# Patient Record
Sex: Female | Born: 1970 | Race: White | Hispanic: No | Marital: Married | Smoking: Former smoker
Health system: Southern US, Community
[De-identification: ages and names within clinical notes are randomized; demographics above are authoritative.]

## PROBLEM LIST (undated history)

## (undated) HISTORY — PX: FRACTURE SURGERY: SHX138

---

## 2009-09-01 ENCOUNTER — Ambulatory Visit: Payer: Self-pay | Admitting: Internal Medicine

## 2009-10-07 ENCOUNTER — Emergency Department: Payer: Self-pay | Admitting: Emergency Medicine

## 2010-01-18 ENCOUNTER — Ambulatory Visit: Payer: Self-pay | Admitting: Internal Medicine

## 2010-08-15 ENCOUNTER — Ambulatory Visit: Payer: Self-pay | Admitting: Internal Medicine

## 2010-11-01 ENCOUNTER — Emergency Department: Payer: Self-pay

## 2010-11-08 ENCOUNTER — Other Ambulatory Visit: Payer: Self-pay | Admitting: Internal Medicine

## 2010-11-11 ENCOUNTER — Ambulatory Visit: Payer: Self-pay | Admitting: Internal Medicine

## 2010-12-01 ENCOUNTER — Ambulatory Visit: Payer: Self-pay | Admitting: Gastroenterology

## 2011-09-01 IMAGING — NM NUCLEAR MEDICINE HEPATOHBILIARY INCLUDE GB
2 series · 12 of 12 positions shown · non-contrast
Comparison: none

REASON FOR EXAM: generalized abd pain
COMMENTS:

[Series 1000: gallbladder dynamic · 4.80mm/px · 6 of 60 frames shown]
[frame 6/60]
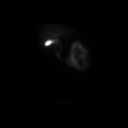
[frame 16/60]
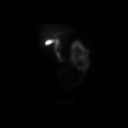
[frame 26/60]
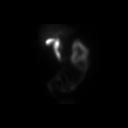
[frame 36/60]
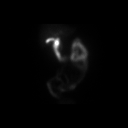
[frame 46/60]
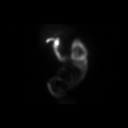
[frame 56/60]
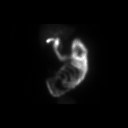

[Series 1000: gallbladder dynamic (results) · 4.80mm/px · 6 of 60 frames shown]
[frame 6/60]
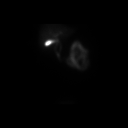
[frame 16/60]
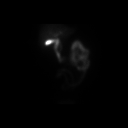
[frame 26/60]
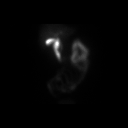
[frame 36/60]
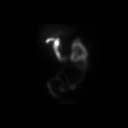
[frame 46/60]
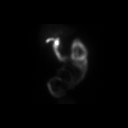
[frame 56/60]
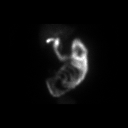

[12 of 12 positions shown; findings below may reference images not displayed]

PROCEDURE:     KNM - KNM HEPATO W/GB EJECT FRACTION  - November 11, 2010 [DATE]

RESULT:     Following intravenous administration of 8.10 mCi technetium 99m
Choletec, there is observed prompt visualization of tracer activity in the
liver 3 minutes. At 30 minutes tracer activity is visualized in the
gallbladder, common duct and proximal small bowel.

The gallbladder ejection fraction at 30 minutes measures 86% which is in the
normal range.
IMPRESSION: 1. Normal hepatobiliary scan.
2. The gallbladder ejection fraction measures 86% which is in the normal
range.

## 2011-09-05 ENCOUNTER — Ambulatory Visit: Payer: Self-pay | Admitting: Obstetrics & Gynecology

## 2012-06-10 ENCOUNTER — Ambulatory Visit: Payer: Self-pay | Admitting: Emergency Medicine

## 2014-12-20 ENCOUNTER — Ambulatory Visit: Payer: Self-pay | Admitting: Physician Assistant

## 2015-11-12 ENCOUNTER — Ambulatory Visit
Admission: EM | Admit: 2015-11-12 | Discharge: 2015-11-12 | Disposition: A | Discharge status: Home / Self Care | Payer: Managed Care, Other (non HMO) | Attending: Family Medicine | Admitting: Family Medicine

## 2015-11-12 ENCOUNTER — Encounter: Payer: Self-pay | Admitting: Emergency Medicine

## 2015-11-12 DIAGNOSIS — H05011 Cellulitis of right orbit: Secondary | ICD-10-CM | POA: Diagnosis not present

## 2015-11-12 DIAGNOSIS — H6981 Other specified disorders of Eustachian tube, right ear: Secondary | ICD-10-CM

## 2015-11-12 DIAGNOSIS — J019 Acute sinusitis, unspecified: Secondary | ICD-10-CM | POA: Diagnosis not present

## 2015-11-12 MED ORDER — AMOXICILLIN-POT CLAVULANATE 875-125 MG PO TABS: 1.0000 | ORAL_TABLET | Freq: Two times a day (BID) | ORAL | Status: DC

## 2015-11-12 MED ORDER — FLUTICASONE PROPIONATE 50 MCG/ACT NA SUSP: 2.0000 | Freq: Every day | NASAL | Status: DC

## 2015-11-12 MED ORDER — LORATADINE-PSEUDOEPHEDRINE ER 10-240 MG PO TB24: 1.0000 | ORAL_TABLET | Freq: Every day | ORAL | Status: DC

## 2015-11-12 NOTE — ED Notes (Signed)
Patient c/o sinus pain and pressure since Wed.

## 2015-11-12 NOTE — ED Provider Notes (Addendum)
CSN: [id number]     Arrival date & time 11/12/15  4098 History   First MD Initiated Contact with Patient 11/12/15 1035    Nurses notes were reviewed. Chief Complaint  Patient presents with  . Facial Pain  . Nasal Congestion    Patient states heaviness our experience and sinus infection.  She has had a lot of them she states that they also can cause facial pressure and facial swelling as well. His yellow she can get something out of her nostrils and the facial swelling and pain she states is on the right side. No fever she reports. She states that she took some Benadryl yesterday and she is postdates and facial swelling on the right side.  (Consider location/radiation/quality/duration/timing/severity/associated sxs/prior Treatment) Patient is a 44 y.o. female presenting with URI. The history is provided by the patient. No language interpreter was used.  URI Presenting symptoms: congestion, ear pain, facial pain and rhinorrhea   Presenting symptoms: no fatigue, no fever and no sore throat   Severity:  Moderate Duration:  3 days Timing:  Constant Progression:  Worsening Chronicity:  New Relieved by:  Nothing Ineffective treatments:  Decongestant and OTC medications Associated symptoms: headaches, sinus pain and swollen glands   Associated symptoms: no arthralgias, no myalgias, no neck pain, no sneezing and no wheezing   Risk factors: not elderly, no chronic cardiac disease, no chronic kidney disease, no chronic respiratory disease, no diabetes mellitus, no immunosuppression, no recent illness, no recent travel and no sick contacts     History reviewed. No pertinent past medical history. History reviewed. No pertinent past surgical history. History reviewed. No pertinent family history. Social History  Substance Use Topics  . Smoking status: Former Games developer  . Smokeless tobacco: None  . Alcohol Use: No   OB History    No data available     Review of Systems  Constitutional:  Negative for fever and fatigue.  HENT: Positive for congestion, ear pain and rhinorrhea. Negative for sneezing and sore throat.   Respiratory: Negative for wheezing.   Musculoskeletal: Negative for myalgias, arthralgias and neck pain.  Neurological: Positive for headaches.    Allergies  Review of patient's allergies indicates no known allergies.  Home Medications   Prior to Admission medications   Medication Sig Start Date End Date Taking? Authorizing Provider  amoxicillin-clavulanate (AUGMENTIN) 875-125 MG tablet Take 1 tablet by mouth 2 (two) times daily. 11/12/15   Hassan Rowan, MD  fluticasone (FLONASE) 50 MCG/ACT nasal spray Place 2 sprays into both nostrils daily. 11/12/15   Hassan Rowan, MD  loratadine-pseudoephedrine (CLARITIN-D 24 HOUR) 10-240 MG 24 hr tablet Take 1 tablet by mouth daily. 11/12/15   Hassan Rowan, MD   Meds Ordered and Administered this Visit  Medications - No data to display  BP 104/81 mmHg  Pulse 84  Temp(Src) 97.4 F (36.3 C) (Tympanic)  Resp 16  Ht  (1.702 m)  Wt 232 lb (105.235 kg)  BMI 36.33 kg/m2  SpO2 98%  LMP 11/10/2015 (Exact Date) No data found.   Physical Exam  Constitutional: She is oriented to person, place, and time. She appears well-developed and well-nourished.  HENT:  Head: Normocephalic and atraumatic.  Right Ear: Hearing, tympanic membrane and ear canal normal. There is tenderness. Tympanic membrane is not erythematous and not retracted.  Left Ear: Hearing, tympanic membrane, external ear and ear canal normal. Tympanic membrane is not erythematous and not retracted.  Nose: Mucosal edema and sinus tenderness present. No nose lacerations, nasal  deformity or septal deviation.  No foreign bodies. Right sinus exhibits maxillary sinus tenderness. Right sinus exhibits no frontal sinus tenderness. Left sinus exhibits no maxillary sinus tenderness and no frontal sinus tenderness.  Mouth/Throat: Oropharynx is clear and moist. Normal  dentition. No lacerations or dental caries.  Definite eustachian tube dysfunction with swelling and tenderness around the right jaw and over the right maxillary sinuses  Eyes: Conjunctivae are normal. Pupils are equal, round, and reactive to light.    There is swelling around the right orbit both upper lid and lower lid is appreciable difference compared to the left eye.  Neck: Normal range of motion. Neck supple.  Cardiovascular: Normal rate.   Musculoskeletal: Normal range of motion. She exhibits no edema.  Neurological: She is alert and oriented to person, place, and time.  Skin: Skin is warm and dry. No erythema.  Psychiatric: She has a normal mood and affect.  Vitals reviewed.   ED Course  Procedures (including critical care time)  Labs Review Labs Reviewed - No data to display  Imaging Review No results found.   Visual Acuity Review  Right Eye Distance:   Left Eye Distance:   Bilateral Distance:    Right Eye Near:   Left Eye Near:    Bilateral Near:         MDM   1. Acute sinusitis, recurrence not specified, unspecified location   2. Eustachian tube dysfunction, right   3. Orbital cellulitis on right     Concerned about possible early orbital cellulitis as well as sinusitis and eustachian tube dysfunction. Consider whether we need to give an IM injection of Rocephin as well but patient is running no fever and the facial swelling has just started. Explained to patient that if this facial swelling gets worse she starts running fever she needs to the emergency room to be seen and evaluated. Also recommend patient obtain a PCP for further treatment as well.    Hassan RowanEugene Markez [place], MD 11/12/15 1119  Hassan RowanEugene Toula Miyasaki, MD 11/12/15 [phone]

## 2015-11-12 NOTE — Discharge Instructions (Signed)
Sinusitis, Adult Sinusitis is redness, soreness, and puffiness (inflammation) of the air pockets in the bones of your face (sinuses). The redness, soreness, and puffiness can cause air and mucus to get trapped in your sinuses. This can allow germs to grow and cause an infection.  HOME CARE   Drink enough fluids to keep your pee (urine) clear or pale yellow.  Use a humidifier in your home.  Run a hot shower to create steam in the bathroom. Sit in the bathroom with the door closed. Breathe in the steam 3-4 times a day.  Put a warm, moist washcloth on your face 3-4 times a day, or as told by your doctor.  Use salt water sprays (saline sprays) to wet the thick fluid in your nose. This can help the sinuses drain.  Only take medicine as told by your doctor. GET HELP RIGHT AWAY IF:   Your pain gets worse.  You have very bad headaches.  You are sick to your stomach (nauseous).  You throw up (vomit).  You are very sleepy (drowsy) all the time.  Your face is puffy (swollen).  Your vision changes.  You have a stiff neck.  You have trouble breathing. MAKE SURE YOU:   Understand these instructions.  Will watch your condition.  Will get help right away if you are not doing well or get worse.   This information is not intended to replace advice given to you by your health care provider. Make sure you discuss any questions you have with your health care provider.   Document Released: 05/22/2008 Document Revised: 12/25/2014 Document Reviewed: 07/09/2012 Elsevier Interactive Patient Education 2016 [employer]  Cellulitis Cellulitis is an infection of the skin and the tissue under the skin. The infected area is usually red and tender. This happens most often in the arms and lower legs. HOME CARE  Take your antibiotic medicine as told. Finish the medicine even if you start to feel better. Keep the infected arm or leg raised (elevated). Put a warm cloth on the area up to 4 times per  day. Only take medicines as told by your doctor. Keep all doctor visits as told. GET HELP IF: You see red streaks on the skin coming from the infected area. Your red area gets bigger or turns a dark color. Your bone or joint under the infected area is painful after the skin heals. Your infection comes back in the same area or different area. You have a puffy (swollen) bump in the infected area. You have new symptoms. You have a fever. GET HELP RIGHT AWAY IF:  You feel very sleepy. You throw up (vomit) or have watery poop (diarrhea). You feel sick and have muscle aches and pains.   This information is not intended to replace advice given to you by your health care provider. Make sure you discuss any questions you have with your health care provider.   Document Released: 05/22/2008 Document Revised: 08/25/2015 Document Reviewed: 02/19/2012 Elsevier Interactive Patient Education 2016 [employer] Warner[employer]ot[employer] Media Barotitis media is inflammation of your middle ear. This occurs when the auditory tube (eustachian tube) leading from the back of your nose (nasopharynx) to your eardrum is blocked. This blockage may result from a cold, environmental allergies, or an upper respiratory infection. Unresolved barotitis media may lead to damage or hearing loss (barotrauma), which may become permanent. HOME CARE INSTRUCTIONS  Use medicines as recommended by your health care provider. Over-the-counter medicines will help unblock the canal and can help during  times of air travel. Do not put anything into your ears to clean or unplug them. Eardrops will not be helpful. Do not swim, dive, or fly until your health care provider says it is all right to do so. If these activities are necessary, chewing gum with frequent, forceful swallowing may help. It is also helpful to hold your nose and gently blow to pop your ears for equalizing pressure changes. This forces air into the eustachian tube. Only take  over-the-counter or prescription medicines for pain, discomfort, or fever as directed by your health care provider. A decongestant may be helpful in decongesting the middle ear and make pressure equalization easier. SEEK MEDICAL CARE IF: You experience a serious form of dizziness in which you feel as if the room is spinning and you feel nauseated (vertigo). Your symptoms only involve one ear. SEEK IMMEDIATE MEDICAL CARE IF:  You develop a severe headache, dizziness, or severe ear pain. You have bloody or pus-like drainage from your ears. You develop a fever. Your problems do not improve or become worse. MAKE SURE YOU:  Understand these instructions. Will watch your condition. Will get help right away if you are not doing well or get worse.   This information is not intended to replace advice given to you by your health care provider. Make sure you discuss any questions you have with your health care provider.   Document Released: 12/01/2000 Document Revised: 09/24/2013 Document Reviewed: 07/01/2013 Elsevier Interactive Patient Education [employer]

## 2016-01-20 ENCOUNTER — Ambulatory Visit
Admission: EM | Admit: 2016-01-20 | Discharge: 2016-01-20 | Disposition: A | Discharge status: Home / Self Care | Payer: Managed Care, Other (non HMO) | Attending: Family Medicine | Admitting: Family Medicine

## 2016-01-20 DIAGNOSIS — R519 Headache, unspecified: Secondary | ICD-10-CM

## 2016-01-20 DIAGNOSIS — R42 Dizziness and giddiness: Secondary | ICD-10-CM

## 2016-01-20 DIAGNOSIS — J019 Acute sinusitis, unspecified: Secondary | ICD-10-CM | POA: Diagnosis not present

## 2016-01-20 DIAGNOSIS — H66002 Acute suppurative otitis media without spontaneous rupture of ear drum, left ear: Secondary | ICD-10-CM | POA: Diagnosis not present

## 2016-01-20 DIAGNOSIS — H6591 Unspecified nonsuppurative otitis media, right ear: Secondary | ICD-10-CM

## 2016-01-20 DIAGNOSIS — R51 Headache: Secondary | ICD-10-CM

## 2016-01-20 MED ORDER — LORATADINE-PSEUDOEPHEDRINE ER 10-240 MG PO TB24: 1.0000 | ORAL_TABLET | Freq: Every day | ORAL | Status: DC

## 2016-01-20 MED ORDER — SALINE SPRAY 0.65 % NA SOLN: 2.0000 | NASAL | Status: DC

## 2016-01-20 MED ORDER — KETOROLAC TROMETHAMINE 10 MG PO TABS: 10.0000 mg | ORAL_TABLET | Freq: Four times a day (QID) | ORAL | Status: AC | PRN

## 2016-01-20 MED ORDER — FLUTICASONE PROPIONATE 50 MCG/ACT NA SUSP: 1.0000 | Freq: Two times a day (BID) | NASAL | Status: AC

## 2016-01-20 MED ORDER — KETOROLAC TROMETHAMINE 60 MG/2ML IM SOLN
60.0000 mg | Freq: Once | INTRAMUSCULAR | Status: AC
  Administered 2016-01-20: 60 mg via INTRAMUSCULAR

## 2016-01-20 MED ORDER — MECLIZINE HCL 25 MG PO TABS: 25.0000 mg | ORAL_TABLET | Freq: Four times a day (QID) | ORAL | Status: DC | PRN

## 2016-01-20 MED ORDER — AMOXICILLIN-POT CLAVULANATE 875-125 MG PO TABS: 1.0000 | ORAL_TABLET | Freq: Two times a day (BID) | ORAL | Status: AC

## 2016-01-20 NOTE — ED Provider Notes (Signed)
CSN: [id number]     Arrival date & time 01/20/16  1707 History   First MD Initiated Contact with Patient 01/20/16 1755     Chief Complaint  Patient presents with  . 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  Started 15 Jan 2016 pressure behind right eye and bridge of nose with nasal congestion/post nasal drip now headache occiput and left ear/posterior ear discomfort.  Vertigo with getting in and out of car for work today visiting client.  Has used bonine in past for sea sickness or driving in the mountains.  Quit her flonase as didn't seem to be helping from November.  Tried OTC motrin, water, excedrin migraine, claritin without relief.  Sick contacts at work.  Needs refill on claritin D.  Patient is a 45 y.o. female presenting with headaches. The history is provided by the patient.  Headache Pain location:  Occipital, L parietal and frontal Quality:  Sharp Radiates to:  Does not radiate Severity currently:  10/10 Severity at highest:  10/10 Onset quality:  Sudden Duration:  6 days Timing:  Constant Progression:  Worsening Chronicity:  New Similar to prior headaches: yes   Context: not activity, not exposure to bright light, not caffeine, not coughing, not defecating, not eating, not stress, not exposure to cold air, not intercourse, not loud noise and not straining   Relieved by:  Nothing Worsened by:  Nothing Ineffective treatments:  Acetaminophen, aspirin and NSAIDs Associated symptoms: congestion, dizziness, drainage, ear pain, sinus pressure, sore throat, URI and visual change   Associated symptoms: no abdominal pain, no back pain, no blurred vision, no cough, no diarrhea, no eye pain, no facial pain, no fatigue, no fever, no focal weakness, no hearing loss, no loss of balance, no myalgias, no nausea,  no near-syncope, no neck pain, no neck stiffness, no numbness, no paresthesias, no photophobia, no seizures, no swollen glands, no syncope, no tingling, no vomiting and no weakness   Congestion:    Location:  Nasal   Interferes with sleep: no     Interferes with eating/drinking: no   Dizziness:    Severity:  Moderate   Duration:  12 hours   Timing:  Intermittent   Progression:  Resolved Ear pain:    Location:  Left   Severity:  Severe   Onset quality:  Sudden   Duration:  3 days   Timing:  Constant   Progression:  Worsening   Chronicity:  New Sore throat:    Severity:  Moderate   Onset quality:  Sudden   Duration:  6 days   Timing:  Constant   Progression:  Unchanged Visual Change:    Location:  Both eyes   Quality: blurred vision     Onset quality:  Sudden   Severity:  Mild   Duration:  12 hours   Timing:  Intermittent   Progression:  Unchanged   Chronicity:  New Risk factors: no anger, no family hx of SAH, does not have insomnia and lifestyle not sedentary     History reviewed. No pertinent past medical history. History reviewed. No pertinent past surgical history. History reviewed. No pertinent family history. Social History  Substance Use Topics  . Smoking status: Former Games developer  . Smokeless tobacco: None  . Alcohol Use: No   OB History    No data available     Review of Systems  Constitutional: Negative for fever,  chills, diaphoresis, activity change, appetite change, fatigue and unexpected weight change.  HENT: Positive for congestion, ear pain, nosebleeds, postnasal drip, rhinorrhea, sinus pressure and sore throat. Negative for dental problem, drooling, ear discharge, facial swelling, hearing loss, mouth sores, sneezing, tinnitus, trouble swallowing and voice change.   Eyes: Negative for blurred vision, photophobia, pain, discharge, redness, itching and visual disturbance.  Respiratory: Negative for cough, choking, chest tightness, shortness of breath,  wheezing and stridor.   Cardiovascular: Negative for chest pain, palpitations, leg swelling, syncope and near-syncope.  Gastrointestinal: Negative for nausea, vomiting, abdominal pain, diarrhea, constipation, blood in stool and abdominal distention.  Endocrine: Negative for cold intolerance and heat intolerance.  Genitourinary: Negative for dysuria, hematuria and difficulty urinating.  Musculoskeletal: Negative for myalgias, back pain, joint swelling, arthralgias, gait problem, neck pain and neck stiffness.  Skin: Negative for color change, pallor, rash and wound.  Allergic/Immunologic: Positive for environmental allergies. Negative for food allergies.  Neurological: Positive for dizziness and headaches. Negative for tremors, focal weakness, seizures, syncope, facial asymmetry, speech difficulty, weakness, light-headedness, numbness, paresthesias and loss of balance.  Hematological: Negative for adenopathy. Does not bruise/bleed easily.  Psychiatric/Behavioral: Negative for behavioral problems, confusion, sleep disturbance and agitation.    Allergies  Review of patient's allergies indicates no known allergies.  Home Medications   Prior to Admission medications   Medication Sig Start Date End Date Taking? Authorizing Provider  amoxicillin-clavulanate (AUGMENTIN) 875-125 MG tablet Take 1 tablet by mouth 2 (two) times daily. 01/20/16 01/30/16  Barbaraann Barthel, NP  fluticasone (FLONASE) 50 MCG/ACT nasal spray Place 1 spray into both nostrils 2 (two) times daily. 01/20/16 01/27/16  Barbaraann Barthel, NP  ketorolac (TORADOL) 10 MG tablet Take 1 tablet (10 mg total) by mouth every 6 (six) hours as needed for moderate pain or severe pain. 01/21/16 01/24/16  Barbaraann Barthel, NP  loratadine-pseudoephedrine (CLARITIN-D 24 HOUR) 10-240 MG 24 hr tablet Take 1 tablet by mouth daily. 01/20/16   Barbaraann Barthel, NP  meclizine (ANTIVERT) 25 MG tablet Take 1 tablet (25 mg total) by mouth 4 (four) times daily as  needed for dizziness. 01/20/16   Barbaraann Barthel, NP  sodium chloride (OCEAN) 0.65 % SOLN nasal spray Place 2 sprays into both nostrils every 2 (two) hours while awake. 01/20/16   Barbaraann Barthel, NP   Meds Ordered and Administered this Visit   Medications  ketorolac (TORADOL) injection 60 mg (60 mg Intramuscular Given 01/20/16 1820)    BP 114/83 mmHg  Pulse 76  Temp(Src) 98.1 F (36.7 C) (Oral)  Resp 16  Ht  (1.702 m)  Wt 230 lb (104.327 kg)  BMI 36.01 kg/m2  SpO2 99%  LMP 01/12/2016 No data found.   Physical Exam  Constitutional: She is oriented to person, place, and time. She appears well-developed and well-nourished. She is active and cooperative.  Non-toxic appearance. She does not have a sickly appearance. She appears ill. No distress.  HENT:  Head: Normocephalic and atraumatic.  Right Ear: Hearing and ear canal normal. There is mastoid tenderness. Tympanic membrane is erythematous and bulging. A middle ear effusion is present.  Left Ear: Hearing, external ear and ear canal normal. A middle ear effusion is present.  Nose: Mucosal edema and rhinorrhea present. No nose lacerations, sinus tenderness, nasal deformity, septal deviation or nasal septal hematoma. No epistaxis.  No foreign bodies. Right sinus exhibits no maxillary sinus tenderness and no frontal sinus tenderness. Left sinus exhibits no maxillary sinus tenderness and no  frontal sinus tenderness.  Mouth/Throat: Uvula is midline and mucous membranes are normal. Mucous membranes are not pale, not dry and not cyanotic. She does not have dentures. No oral lesions. No trismus in the jaw. Normal dentition. No dental abscesses, uvula swelling, lacerations or dental caries. Posterior oropharyngeal edema and posterior oropharyngeal erythema present. No oropharyngeal exudate or tonsillar abscesses.  Eyes: Conjunctivae, EOM and lids are normal. Pupils are equal, round, and reactive to light. Right eye exhibits no chemosis, no  discharge, no exudate and no hordeolum. No foreign body present in the right eye. Left eye exhibits no chemosis, no discharge, no exudate and no hordeolum. No foreign body present in the left eye. Right conjunctiva is not injected. Right conjunctiva has no hemorrhage. Left conjunctiva is not injected. Left conjunctiva has no hemorrhage. No scleral icterus. Right eye exhibits normal extraocular motion and no nystagmus. Left eye exhibits normal extraocular motion and no nystagmus. Right pupil is round and reactive. Left pupil is round and reactive. Pupils are equal.  Neck: Trachea normal and normal range of motion. Neck supple. No tracheal tenderness, no spinous process tenderness and no muscular tenderness present. No rigidity. No tracheal deviation, no edema, no erythema and normal range of motion present. No thyroid mass and no thyromegaly present.  Cardiovascular: Normal rate, regular rhythm, S1 normal, S2 normal, normal heart sounds and intact distal pulses.  PMI is not displaced.  Exam reveals no gallop and no friction rub.   No murmur heard. Pulmonary/Chest: Effort normal and breath sounds normal. No accessory muscle usage or stridor. No respiratory distress. She has no decreased breath sounds. She has no wheezes. She has no rhonchi. She has no rales. She exhibits no tenderness.  Abdominal: Soft. She exhibits no distension.  Musculoskeletal: Normal range of motion. She exhibits no edema or tenderness.       Right shoulder: Normal.       Left shoulder: Normal.       Right hip: Normal.       Left hip: Normal.       Right knee: Normal.       Left knee: Normal.       Cervical back: Normal.       Right hand: Normal.       Left hand: Normal.  Lymphadenopathy:       Head (right side): No submental, no submandibular, no tonsillar, no preauricular, no posterior auricular and no occipital adenopathy present.       Head (left side): No submental, no submandibular, no tonsillar, no preauricular, no  posterior auricular and no occipital adenopathy present.    She has no cervical adenopathy.       Right cervical: No superficial cervical, no deep cervical and no posterior cervical adenopathy present.      Left cervical: No superficial cervical, no deep cervical and no posterior cervical adenopathy present.  TTP post auricular and posterior cervical lymph nodes left  Neurological: She is alert and oriented to person, place, and time. She has normal strength. She is not disoriented. She displays no atrophy and no tremor. No cranial nerve deficit or sensory deficit. She exhibits normal muscle tone. She displays no seizure activity. Coordination and gait normal. GCS eye subscore is 4. GCS verbal subscore is 5. GCS motor subscore is 6.  Skin: Skin is warm, dry and intact. No abrasion, no bruising, no burn, no ecchymosis, no laceration, no lesion, no petechiae and no rash noted. She is not diaphoretic. No cyanosis or erythema.  No pallor. Nails show no clubbing.  Psychiatric: She has a normal mood and affect. Her speech is normal and behavior is normal. Judgment and thought content normal. Cognition and memory are normal.  Nursing note and vitals reviewed.   ED Course  Procedures (including critical care time)  Labs Review Labs Reviewed - No data to display  Imaging Review No results found.  1820 Toradol 60mg  IM x 1 administered by Alphonzo Lemmings.  Discussed with patient will recheck blood pressure in 15 minutes.  Slow movements as medicine can lower BP.  Given cup of water to sip.  Patient verbalized understanding of information/instructions, agreed with plan of care and had no further questions at this time.  1853  VSS patient feels much better 4/10 now headache almost resolved.  "I wish I would have come here Tuesday and not waited so long". Would like toradol Rx tabs in case headache tomorrow.  Discussed with patient headache should resolve with treatment of otitis media/sinusitis.  Patient agreed  with plan of care and had no further questions at this time. Filed Vitals:   01/20/16 1745 01/20/16 1848  BP: 115/76 114/83  Pulse: 83 76  Temp: 98.1 F (36.7 C)   Resp: 16    MDM   1. Acute rhinosinusitis   2. Acute suppurative otitis media of left ear without spontaneous rupture of tympanic membrane, recurrence not specified   3. Acute intractable headache, unspecified headache type   4. Vertigo   5. Otitis media with effusion, right    Restart flonase 1 spray each nostril BID, saline nasal 2 sprays each nostril q2hours prn congestion.  Claritin D po daily.  Was also given Rx for augmentin for otitis media will provide cross coverage for sinusitis.  No evidence of systemic bacterial infection, non toxic and well hydrated.  I do not see where any further testing or imaging is necessary at this time.   I will suggest supportive care, rest, good hygiene and encourage the patient to take adequate fluids.  The patient is to return to clinic or EMERGENCY ROOM if symptoms worsen or change significantly.  Exitcare handout on sinusitis given to patient.  Patient verbalized agreement and understanding of treatment plan and had no further questions at this time.   P2:  Hand washing and cover cough  Claritin D 10mg  po daily.  flonase 1 spray each nostril BID.  Patient may use normal saline nasal spray as needed.  Consider antihistamine or nasal steroid use.  Avoid triggers if possible.  Shower prior to bedtime if exposed to triggers.  If allergic dust/dust mites recommend mattress/pillow covers/encasements; washing linens, vacuuming, sweeping, dusting weekly.  Call or return to clinic as needed if these symptoms worsen or fail to improve as anticipated.   Exitcare handout on allergic rhinitis given to patient.  Patient verbalized understanding of instructions, agreed with plan of care and had no further questions at this time.  P2:  Avoidance and hand washing.  Discussed with patient otitis media with  effusion probably causing vertigo but could also be age. If no improvement with flonase and claritin D stop claritin and start Meclizine 25mg  po QID prn.  Supportive treatment may take up to 4 doses meclizine per day max 100mg  per 24 hours.  Discussed signs/symptoms stroke. ER Follow up if aphasia, dysphasia, visual changes, weakness, fall, worst headache of life, incoordination, fever, ear discharge.  Consider ENT evaluation/follow up with PCM if worsening symptoms not controlled with meclizine or needs Rx refill.  Patient verbalized understanding of information/agreed with plan of care and had no further questions at this time.  Augmentin  po BID x 10 days.  Treatment as ordered.  Symptomatic therapy suggested fluids, NSAIDs and rest.  May take Tylenol or Motrin for fevers.  Call or return to clinic as needed if these symptoms worsen or fail to improve as anticipated. Exitcare handout on otitis media given to patient.  Patient verbalized agreement and understanding of treatment plan.   P2:  Hand washing  Supportive treatment.   No evidence of invasive bacterial infection, non toxic and well hydrated.  This is most likely self limiting viral infection.  I do not see where any further testing or imaging is necessary at this time.   I will suggest supportive care, rest, good hygiene and encourage the patient to take adequate fluids.  The patient is to return to clinic or EMERGENCY ROOM if symptoms worsen or change significantly e.g. ear pain, fever, purulent discharge from ears or bleeding.  Exitcare handout on otitis media with effusion given to patient.  Patient verbalized agreement and understanding of treatment plan.    May take toradol  po QID prn starting tomorrow. Max  per 24 hours.  Avoid advil/aleve/naproxen/motrin while on toradol.  Hydrate hydrate, hydrate.   Had toradol injection in clinic tonight.  For acute pain, rest, and intermittent application of heat, and PRN po toradol.  Avoid  known triggers e.g. sleep deprivation, foods, stress, dehydration.  If headache is the worst headache of entire life and came on like a clap of thunder patient was instructed to go to the Emergency Room for re-evaluation.  Call or return to clinic as needed if these symptoms worsen or fail to improve as anticipated.  Exitcare handout on sinus headaches given to patient.  Discussed probably related to ear infection/fluid middle ears/sinus pressure will abate with treatment of otitis/sinusitis/rhinitis.  Patient verbalized agreement and understanding of treatment plan and had no further questions at this time.Marland Kitchen P2:  Diet and fitness    Barbaraann Barthel, NP 01/20/16 1920

## 2016-01-20 NOTE — Discharge Instructions (Signed)
Vertigo Vertigo means you feel like you or your surroundings are moving when they are not. Vertigo can be dangerous if it occurs when you are at work, driving, or performing difficult activities.  CAUSES  Vertigo occurs when there is a conflict of signals sent to your brain from the visual and sensory systems in your body. There are many different causes of vertigo, including:  Infections, especially in the inner ear.  A bad reaction to a drug or misuse of alcohol and medicines.  Withdrawal from drugs or alcohol.  Rapidly changing positions, such as lying down or rolling over in bed.  A migraine headache.  Decreased blood flow to the brain.  Increased pressure in the brain from a head injury, infection, tumor, or bleeding. SYMPTOMS  You may feel as though the world is spinning around or you are falling to the ground. Because your balance is upset, vertigo can cause nausea and vomiting. You may have involuntary eye movements (nystagmus). DIAGNOSIS  Vertigo is usually diagnosed by physical exam. If the cause of your vertigo is unknown, your caregiver may perform imaging tests, such as an MRI scan (magnetic resonance imaging). TREATMENT  Most cases of vertigo resolve on their own, without treatment. Depending on the cause, your caregiver may prescribe certain medicines. If your vertigo is related to body position issues, your caregiver may recommend movements or procedures to correct the problem. In rare cases, if your vertigo is caused by certain inner ear problems, you may need surgery. HOME CARE INSTRUCTIONS   Follow your caregiver's instructions.  Avoid driving.  Avoid operating heavy machinery.  Avoid performing any tasks that would be dangerous to you or others during a vertigo episode.  Tell your caregiver if you notice that certain medicines seem to be causing your vertigo. Some of the medicines used to treat vertigo episodes can actually make them worse in some people. SEEK  IMMEDIATE MEDICAL CARE IF:   Your medicines do not relieve your vertigo or are making it worse.  You develop problems with talking, walking, weakness, or using your arms, hands, or legs.  You develop severe headaches.  Your nausea or vomiting continues or gets worse.  You develop visual changes.  A family member notices behavioral changes.  Your condition gets worse. MAKE SURE YOU:  Understand these instructions.  Will watch your condition.  Will get help right away if you are not doing well or get worse.   This information is not intended to replace advice given to you by your health care provider. Make sure you discuss any questions you have with your health care provider.   Document Released: 09/13/2005 Document Revised: 02/26/2012 Document Reviewed: 03/29/2015 Elsevier Interactive Patient Education 2016 [employer]. Allergic Rhinitis Allergic rhinitis is when the mucous membranes in the nose respond to allergens. Allergens are particles in the air that cause your body to have an allergic reaction. This causes you to release allergic antibodies. Through a chain of events, these eventually cause you to release histamine into the blood stream. Although meant to protect the body, it is this release of histamine that causes your discomfort, such as frequent sneezing, congestion, and an itchy, runny nose.  CAUSES Seasonal allergic rhinitis (hay fever) is caused by pollen allergens that may come from grasses, trees, and weeds. Year-round allergic rhinitis (perennial allergic rhinitis) is caused by allergens such as house dust mites, pet dander, and mold spores. SYMPTOMS  Nasal stuffiness (congestion).  Itchy, runny nose with sneezing and tearing of the  eyes. DIAGNOSIS Your health care provider can help you determine the allergen or allergens that trigger your symptoms. If you and your health care provider are unable to determine the allergen, skin or blood testing may be used.  Your health care provider will diagnose your condition after taking your health history and performing a physical exam. Your health care provider may assess you for other related conditions, such as asthma, pink eye, or an ear infection. TREATMENT Allergic rhinitis does not have a cure, but it can be controlled by:  Medicines that block allergy symptoms. These may include allergy shots, nasal sprays, and oral antihistamines.  Avoiding the allergen. Hay fever may often be treated with antihistamines in pill or nasal spray forms. Antihistamines block the effects of histamine. There are over-the-counter medicines that may help with nasal congestion and swelling around the eyes. Check with your health care provider before taking or giving this medicine. If avoiding the allergen or the medicine prescribed do not work, there are many new medicines your health care provider can prescribe. Stronger medicine may be used if initial measures are ineffective. Desensitizing injections can be used if medicine and avoidance does not work. Desensitization is when a patient is given ongoing shots until the body becomes less sensitive to the allergen. Make sure you follow up with your health care provider if problems continue. HOME CARE INSTRUCTIONS It is not possible to completely avoid allergens, but you can reduce your symptoms by taking steps to limit your exposure to them. It helps to know exactly what you are allergic to so that you can avoid your specific triggers. SEEK MEDICAL CARE IF:  You have a fever.  You develop a cough that does not stop easily (persistent).  You have shortness of breath.  You start wheezing.  Symptoms interfere with normal daily activities.   This information is not intended to replace advice given to you by your health care provider. Make sure you discuss any questions you have with your health care provider.   Document Released: 08/29/2001 Document Revised: 12/25/2014  Document Reviewed: 08/11/2013 Elsevier Interactive Patient Education 2016 [employer] Inc[employer] Sinusitis, Adult Sinusitis is redness, soreness, and inflammation of the paranasal sinuses. Paranasal sinuses are air pockets within the bones of your face. They are located beneath your eyes, in the middle of your forehead, and above your eyes. In healthy paranasal sinuses, mucus is able to drain out, and air is able to circulate through them by way of your nose. However, when your paranasal sinuses are inflamed, mucus and air can become trapped. This can allow bacteria and other germs to grow and cause infection. Sinusitis can develop quickly and last only a short time (acute) or continue over a long period (chronic). Sinusitis that lasts for more than 12 weeks is considered chronic. CAUSES Causes of sinusitis include:  Allergies.  Structural abnormalities, such as displacement of the cartilage that separates your nostrils (deviated septum), which can decrease the air flow through your nose and sinuses and affect sinus drainage.  Functional abnormalities, such as when the small hairs (cilia) that line your sinuses and help remove mucus do not work properly or are not present. SIGNS AND SYMPTOMS Symptoms of acute and chronic sinusitis are the same. The primary symptoms are pain and pressure around the affected sinuses. Other symptoms include:  Upper toothache.  Earache.  Headache.  Bad breath.  Decreased sense of smell and taste.  A cough, which worsens when you are lying flat.  Fatigue.  Fever.  Thick drainage from your nose, which often is green and may contain pus (purulent).  Swelling and warmth over the affected sinuses. DIAGNOSIS Your health care provider will perform a physical exam. During your exam, your health care provider may perform any of the following to help determine if you have acute sinusitis or chronic sinusitis:  Look in your nose for signs of abnormal growths in your  nostrils (nasal polyps).  Tap over the affected sinus to check for signs of infection.  View the inside of your sinuses using an imaging device that has a light attached (endoscope). If your health care provider suspects that you have chronic sinusitis, one or more of the following tests may be recommended:  Allergy tests.  Nasal culture. A sample of mucus is taken from your nose, sent to a lab, and screened for bacteria.  Nasal cytology. A sample of mucus is taken from your nose and examined by your health care provider to determine if your sinusitis is related to an allergy. TREATMENT Most cases of acute sinusitis are related to a viral infection and will resolve on their own within 10 days. Sometimes, medicines are prescribed to help relieve symptoms of both acute and chronic sinusitis. These may include pain medicines, decongestants, nasal steroid sprays, or saline sprays. However, for sinusitis related to a bacterial infection, your health care provider will prescribe antibiotic medicines. These are medicines that will help kill the bacteria causing the infection. Rarely, sinusitis is caused by a fungal infection. In these cases, your health care provider will prescribe antifungal medicine. For some cases of chronic sinusitis, surgery is needed. Generally, these are cases in which sinusitis recurs more than 3 times per year, despite other treatments. HOME CARE INSTRUCTIONS  Drink plenty of water. Water helps thin the mucus so your sinuses can drain more easily.  Use a humidifier.  Inhale steam 3-4 times a day (for example, sit in the bathroom with the shower running).  Apply a warm, moist washcloth to your face 3-4 times a day, or as directed by your health care provider.  Use saline nasal sprays to help moisten and clean your sinuses.  Take medicines only as directed by your health care provider.  If you were prescribed either an antibiotic or antifungal medicine, finish it all  even if you start to feel better. SEEK IMMEDIATE MEDICAL CARE IF:  You have increasing pain or severe headaches.  You have nausea, vomiting, or drowsiness.  You have swelling around your face.  You have vision problems.  You have a stiff neck.  You have difficulty breathing.   This information is not intended to replace advice given to you by your health care provider. Make sure you discuss any questions you have with your health care provider.   Document Released: 12/04/2005 Document Revised: 12/25/2014 Document Reviewed: 12/19/2011 Elsevier Interactive Patient Education 2016 [employer]. Otitis Media, Adult Otitis media is redness, soreness, and inflammation of the middle ear. Otitis media may be caused by allergies or, most commonly, by infection. Often it occurs as a complication of the common cold. SIGNS AND SYMPTOMS Symptoms of otitis media may include:  Earache.  Fever.  Ringing in your ear.  Headache.  Leakage of fluid from the ear. DIAGNOSIS To diagnose otitis media, your health care provider will examine your ear with an otoscope. This is an instrument that allows your health care provider to see into your ear in order to examine your eardrum. Your health care provider also will ask  you questions about your symptoms. TREATMENT  Typically, otitis media resolves on its own within 3-5 days. Your health care provider may prescribe medicine to ease your symptoms of pain. If otitis media does not resolve within 5 days or is recurrent, your health care provider may prescribe antibiotic medicines if he or she suspects that a bacterial infection is the cause. HOME CARE INSTRUCTIONS   If you were prescribed an antibiotic medicine, finish it all even if you start to feel better.  Take medicines only as directed by your health care provider.  Keep all follow-up visits as directed by your health care provider. SEEK MEDICAL CARE IF:  You have otitis media only in one ear,  or bleeding from your nose, or both.  You notice a lump on your neck.  You are not getting better in 3-5 days.  You feel worse instead of better. SEEK IMMEDIATE MEDICAL CARE IF:   You have pain that is not controlled with medicine.  You have swelling, redness, or pain around your ear or stiffness in your neck.  You notice that part of your face is paralyzed.  You notice that the bone behind your ear (mastoid) is tender when you touch it. MAKE SURE YOU:   Understand these instructions.  Will watch your condition.  Will get help right away if you are not doing well or get worse.   This information is not intended to replace advice given to you by your health care provider. Make sure you discuss any questions you have with your health care provider.   Document Released: 09/08/2004 Document Revised: 12/25/2014 Document Reviewed: 07/01/2013 Elsevier Interactive Patient Education 2016 Elsevier Inc. Sinus Headache A sinus headache occurs when the paranasal sinuses become clogged or swollen. Paranasal sinuses are air pockets within the bones of the face. Sinus headaches can range from mild to severe. CAUSES A sinus headache can result from various conditions that affect the sinuses, such as:  Colds.  Sinus infections.  Allergies. SYMPTOMS The main symptom of this condition is a headache that may feel like pain or pressure in the face, forehead, ears, or upper teeth. People who have a sinus headache often have other symptoms, such as:  Congested or runny nose.  Fever.  Inability to smell. Weather changes can make symptoms worse. DIAGNOSIS This condition may be diagnosed based on:  A physical exam and medical history.  Imaging tests, such as a CT scan and MRI, to check for problems with the sinuses.  A specialist may look into the sinuses with a tool that has a camera (endoscopy). TREATMENT Treatment for this condition depends on the cause.  Sinus pain that is caused  by a sinus infection may be treated with antibiotic medicine.  Sinus pain that is caused by allergies may be helped by allergy medicines (antihistamines) and medicated nasal sprays.  Sinus pain that is caused by congestion may be helped by flushing the nose and sinuses with saline solution. HOME CARE INSTRUCTIONS  Take medicines only as directed by your health care provider.  If you were prescribed an antibiotic medicine, finish all of it even if you start to feel better.  If you have congestion, use a nasal spray to help reduce pressure.  If directed, apply a warm, moist washcloth to your face to help relieve pain. SEEK MEDICAL CARE IF:  You have headaches more than one time each week.  You have sensitivity to light or sound.  You have a fever.  You feel sick  to your stomach (nauseous) or you throw up (vomit).  Your headaches do not get better with treatment. Many people think that they have a sinus headache when they actually have migraines or tension headaches. SEEK IMMEDIATE MEDICAL CARE IF:  You have vision problems.  You have sudden, severe pain in your face or head.  You have a seizure.  You are confused.  You have a stiff neck.   This information is not intended to replace advice given to you by your health care provider. Make sure you discuss any questions you have with your health care provider.   Document Released: 01/11/2005 Document Revised: 04/20/2015 Document Reviewed: 11/30/2014 Elsevier Interactive Patient Education [employer]

## 2016-01-20 NOTE — ED Notes (Signed)
X 6 days. Pain has been "moving". Started off behind her right eye, with sinus pressure. Pain has moved to occipital lobe. Pain has been a 10/10. Pt also notes nausea and dizziness at times, as well as blurred vision. BP is 115/76 today. No slurred speech or decreased strength. Pt has tried ibuprofen, extra water intake, Excedrin Migraine, Claritin.

## 2017-02-19 ENCOUNTER — Encounter: Payer: Self-pay | Admitting: Emergency Medicine

## 2017-02-19 ENCOUNTER — Ambulatory Visit
Admission: EM | Admit: 2017-02-19 | Discharge: 2017-02-19 | Disposition: A | Discharge status: Home / Self Care | Payer: 59 | Attending: Family Medicine | Admitting: Family Medicine

## 2017-02-19 DIAGNOSIS — B9789 Other viral agents as the cause of diseases classified elsewhere: Secondary | ICD-10-CM

## 2017-02-19 DIAGNOSIS — Z889 Allergy status to unspecified drugs, medicaments and biological substances status: Secondary | ICD-10-CM

## 2017-02-19 DIAGNOSIS — J069 Acute upper respiratory infection, unspecified: Secondary | ICD-10-CM

## 2017-02-19 NOTE — ED Triage Notes (Signed)
Patient c/o cough, congestion, and sinus pressure that started yesterday.

## 2017-02-19 NOTE — ED Provider Notes (Signed)
[facility]    CSN: [id number] Arrival date & time: 02/19/17  1027     History   Chief Complaint Chief Complaint  Patient presents with  . Cough  . Sinus Problem    HPI Wendy Andersen is a 46 y.o. female.   The history is provided by the patient.  Cough  Associated symptoms: rhinorrhea   Associated symptoms: no headaches, no myalgias and no wheezing   Sinus Problem  Pertinent negatives include no headaches.  URI  Presenting symptoms: congestion, cough and rhinorrhea   Severity:  Moderate Onset quality:  Sudden Duration:  1 day Timing:  Constant Progression:  Worsening Chronicity:  New Relieved by:  None tried Ineffective treatments:  None tried Associated symptoms: sneezing   Associated symptoms: no headaches, no myalgias, no neck pain, no sinus pain (some pressure), no swollen glands and no wheezing   Risk factors: not elderly, no chronic cardiac disease, no chronic kidney disease, no chronic respiratory disease (allergies), no diabetes mellitus, no immunosuppression, no recent illness, no recent travel and no sick contacts     History reviewed. No pertinent past medical history.  There are no active problems to display for this patient.   Past Surgical History:  Procedure Laterality Date  . FRACTURE SURGERY      OB History    No data available       Home Medications    Prior to Admission medications   Medication Sig Start Date End Date Taking? Authorizing Provider  fluticasone (FLONASE) 50 MCG/ACT nasal spray Place 1 spray into both nostrils 2 (two) times daily. 01/20/16 01/27/16  Barbaraann Barthel, NP  loratadine-pseudoephedrine (CLARITIN-D 24 HOUR) 10-240 MG 24 hr tablet Take 1 tablet by mouth daily. 01/20/16   Barbaraann Barthel, NP    Family History History reviewed. No pertinent family history.  Social History Social History  Substance Use Topics  . Smoking status: Former Games developer  . Smokeless tobacco: Never Used  . Alcohol use No       Allergies   Patient has no known allergies.   Review of Systems Review of Systems  HENT: Positive for congestion, rhinorrhea and sneezing. Negative for sinus pain (some pressure).   Respiratory: Positive for cough. Negative for wheezing.   Musculoskeletal: Negative for myalgias and neck pain.  Neurological: Negative for headaches.     Physical Exam Triage Vital Signs ED Triage Vitals  Enc Vitals Group     BP 02/19/17 1046 111/82     Pulse Rate 02/19/17 1046 92     Resp 02/19/17 1046 16     Temp 02/19/17 1046 97.8 F (36.6 C)     Temp Source 02/19/17 1046 Oral     SpO2 02/19/17 1046 98 %     Weight 02/19/17 1044 230 lb (104.3 kg)     Height 02/19/17 1044 5\' 7"  (1.702 m)     Head Circumference --      Peak Flow --      Pain Score 02/19/17 1045 8     Pain Loc --      Pain Edu? --      Excl. in GC? --    No data found.   Updated Vital Signs BP 111/82 (BP Location: Left Arm)   Pulse 92   Temp 97.8 F (36.6 C) (Oral)   Resp 16   Ht 5\' 7"  (1.702 m)   Wt 230 lb (104.3 kg)   LMP 12/07/2016   SpO2 98%  BMI 36.02 kg/m   Visual Acuity Right Eye Distance:   Left Eye Distance:   Bilateral Distance:    Right Eye Near:   Left Eye Near:    Bilateral Near:     Physical Exam  Constitutional: She appears well-developed and well-nourished. No distress.  HENT:  Head: Normocephalic and atraumatic.  Right Ear: Tympanic membrane, external ear and ear canal normal.  Left Ear: Tympanic membrane, external ear and ear canal normal.  Nose: Rhinorrhea present. No mucosal edema, nose lacerations, sinus tenderness, nasal deformity, septal deviation or nasal septal hematoma. No epistaxis.  No foreign bodies. Right sinus exhibits no maxillary sinus tenderness and no frontal sinus tenderness. Left sinus exhibits no maxillary sinus tenderness and no frontal sinus tenderness.  Mouth/Throat: Uvula is midline, oropharynx is clear and moist and mucous membranes are normal. No  oropharyngeal exudate.  Eyes: Conjunctivae and EOM are normal. Pupils are equal, round, and reactive to light. Right eye exhibits no discharge. Left eye exhibits no discharge. No scleral icterus.  Neck: Normal range of motion. Neck supple. No thyromegaly present.  Cardiovascular: Normal rate, regular rhythm and normal heart sounds.   Pulmonary/Chest: Effort normal and breath sounds normal. No respiratory distress. She has no wheezes. She has no rales.  Lymphadenopathy:    She has no cervical adenopathy.  Skin: She is not diaphoretic.  Nursing note and vitals reviewed.    UC Treatments / Results  Labs (all labs ordered are listed, but only abnormal results are displayed) Labs Reviewed - No data to display  EKG  EKG Interpretation None       Radiology No results found.  Procedures Procedures (including critical care time)  Medications Ordered in UC Medications - No data to display   Initial Impression / Assessment and Plan / UC Course  I have reviewed the triage vital signs and the nursing notes.  Pertinent labs & imaging results that were available during my care of the patient were reviewed by me and considered in my medical decision making (see chart for details).       Final Clinical Impressions(s) / UC Diagnoses   Final diagnoses:  Viral URI with cough  H/O seasonal allergies    New Prescriptions Discharge Medication List as of 02/19/2017 11:12 AM     1. diagnosis reviewed with patient 2. Recommend supportive treatment with rest, increased fluids, otc meds 3. Follow-up prn if symptoms worsen or don't improve   Payton Mccallumrlando Janin Kozlowski, MD 02/19/17 1239

## 2017-02-22 ENCOUNTER — Telehealth: Payer: Self-pay

## 2017-02-22 NOTE — Telephone Encounter (Signed)
Courtesy call back completed today for patient's recent visit at [facility]. Patient did not answer, left message on machine to call back with any questions or concerns.   

## 2019-08-12 ENCOUNTER — Other Ambulatory Visit: Payer: Self-pay

## 2019-08-12 ENCOUNTER — Ambulatory Visit
Admission: EM | Admit: 2019-08-12 | Discharge: 2019-08-12 | Disposition: A | Discharge status: Home / Self Care | Payer: 59 | Attending: Urgent Care | Admitting: Urgent Care

## 2019-08-12 ENCOUNTER — Encounter: Payer: Self-pay | Admitting: Emergency Medicine

## 2019-08-12 DIAGNOSIS — H6593 Unspecified nonsuppurative otitis media, bilateral: Secondary | ICD-10-CM

## 2019-08-12 DIAGNOSIS — Z889 Allergy status to unspecified drugs, medicaments and biological substances status: Secondary | ICD-10-CM | POA: Diagnosis not present

## 2019-08-12 DIAGNOSIS — R42 Dizziness and giddiness: Secondary | ICD-10-CM | POA: Diagnosis not present

## 2019-08-12 MED ORDER — MECLIZINE HCL 25 MG PO TABS: 25.0000 mg | ORAL_TABLET | Freq: Three times a day (TID) | ORAL | 0 refills | Status: AC | PRN

## 2019-08-12 MED ORDER — CLARITIN-D 24 HOUR 10-240 MG PO TB24: 1.0000 | ORAL_TABLET | Freq: Every day | ORAL | 0 refills | Status: AC

## 2019-08-12 NOTE — ED Triage Notes (Signed)
Patient c/o dizziness that started about 3 weeks ago. She states its positional and when she gets up she is very dizzy. She states she vomited x 1 today.

## 2019-08-12 NOTE — Discharge Instructions (Signed)
It was very nice seeing you today in clinic. Thank you for entrusting me with your care.  ° °Please utilize the medications that we discussed. Your prescriptions have been called in to your pharmacy.  ° °Make arrangements to follow up with your regular doctor in 1 week for re-evaluation if not improving. If your symptoms/condition worsens, please seek follow up care either here or in the ER. Please remember, our [place] providers are "right here with you" when you need us.  ° °Again, it was my pleasure to take care of you today. Thank you for choosing our clinic. I hope that you start to feel better quickly.  ° °Ninetta Adelstein, MSN, APRN, FNP-C, CEN °Advanced Practice Provider °[facility] ° °

## 2019-08-12 NOTE — ED Provider Notes (Signed)
[place], [place]   Name: Wendy Andersen DOB: 1971-09-12 MRN: [id number] CSN: [id number] PCP: System, Pcp Not In  Arrival date and time:  08/12/19 0920  Chief Complaint:  Dizziness   NOTE: Prior to seeing the patient today, I have reviewed the triage nursing documentation and vital signs. Clinical staff has updated patient's PMH/PSHx, current medication list, and drug allergies/intolerances to ensure comprehensive history available to assist in medical decision making.   History:   HPI: Wendy Andersen is a 48 y.o. female who presents today with complaints of 3-4 weeks of vertiginous symptoms. Patient notes that she feels better when standing. Room spins when supine and she becomes nauseated when she lies on her RIGHT side. Patient reports that she vomited a small amount of bilious emesis earlier this morning. Patient has had similar episodes in the past that were self limiting. RIGHT ear is painful at times. Patient is eating and drinking well.  PMH (+) for seasonal allergies, however she does not take allergy medications. Patient has not experienced any upper respiratory symptoms. She denies fevers.   History reviewed. No pertinent past medical history.  Past Surgical History:  Procedure Laterality Date  . FRACTURE SURGERY      History reviewed. No pertinent family history.  Social History   Tobacco Use  . Smoking status: Former Research scientist (life sciences)  . Smokeless tobacco: Never Used  Substance Use Topics  . Alcohol use: No  . Drug use: Never    There are no active problems to display for this patient.   Home Medications:    No outpatient medications have been marked as taking for the 08/12/19 encounter [facility] Encounter).    Allergies:   Patient has no known allergies.  Review of Systems (ROS): Review of Systems  Constitutional: Negative for chills and fever.  HENT: Positive for ear pain.   Respiratory: Negative for cough and shortness of breath.   Cardiovascular: Negative  for chest pain and palpitations.  Gastrointestinal: Positive for nausea and vomiting.  Musculoskeletal: Negative for neck pain.  Neurological: Positive for dizziness. Negative for syncope, weakness and headaches.  Hematological: Negative for adenopathy.  All other systems reviewed and are negative.    Vital Signs: Today's Vitals   08/12/19 0936 08/12/19 0937 08/12/19 1008  BP: 117/73    Pulse: 69    Resp: 18    Temp: 98.1 F (36.7 C)    TempSrc: Oral    SpO2: 96%    Weight:  238 lb 8 oz (108.2 kg)   Height:  5\' 6"  (1.676 m)   PainSc:  0-No pain 0-No pain    Physical Exam: Physical Exam  Constitutional: She is oriented to person, place, and time and well-developed, well-nourished, and in no distress.  HENT:  Head: Normocephalic and atraumatic.  Right Ear: Hearing and ear canal normal. There is tenderness. No drainage or swelling. Tympanic membrane is not erythematous and not bulging. A middle ear effusion (cloudy fluid) is present.  Left Ear: Hearing, external ear and ear canal normal. No tenderness. Tympanic membrane is not erythematous and not bulging. A middle ear effusion (mild serous) is present.  Mouth/Throat: Mucous membranes are normal.  Cardiovascular: Normal rate, regular rhythm, normal heart sounds and intact distal pulses. Exam reveals no gallop and no friction rub.  No murmur heard. Pulmonary/Chest: Effort normal and breath sounds normal. No respiratory distress. She has no wheezes. She has no rales.  Neurological: She is alert and oriented to person, place, and time. Gait normal. GCS  score is 15.  Skin: Skin is warm and dry. No rash noted.  Psychiatric: Mood, memory, affect and judgment normal.  Nursing note and vitals reviewed.   Urgent Care Treatments / Results:   LABS: PLEASE NOTE: all labs that were ordered this encounter are listed, however only abnormal results are displayed. Labs Reviewed - No data to display  EKG: -None  RADIOLOGY: No results  found.  PROCEDURES: Procedures  MEDICATIONS RECEIVED THIS VISIT: Medications - No data to display  PERTINENT CLINICAL COURSE NOTES/UPDATES:   Initial Impression / Assessment and Plan / Urgent Care Course:  Pertinent labs & imaging results that were available during my care of the patient were personally reviewed by me and considered in my medical decision making (see lab/imaging section of note for values and interpretations).  Wendy Andersen is a 48 y.o. female who presents to [facility] today with complaints of Dizziness   Patient is well appearing overall in clinic today. She does not appear to be in any acute distress. Presenting symptoms (see HPI) and exam as documented above. Symptoms consistent with BILATERAL ear effusions secondary to suspected allergic etiology (R>L) Patient has not had any fevers. Fluid cloudy on RIGHT side; patient may be developing an infection, however I do not feel that she needs antibiotics at this time; will defer. Will add Claritin-D to help dry up effusions over the next few days. She was advised to switch to standard loratadine next week. Patient has fluticasone that she uses from time to time. This may also be effective in improving her current symptoms. Will also give patient a supply of meclizine for PRN use. Dicussed supportive care measures at home. Swas encouraged to ensure adequate hydration. Patient may use APAP and/or IBU on an as needed basis for discomfort.   Discussed follow up with primary care physician in 1 week for re-evaluation. I have reviewed the follow up and strict return precautions for any new or worsening symptoms. Patient is aware of symptoms that would be deemed urgent/emergent, and would thus require further evaluation either here or in the emergency department. At the time of discharge, she verbalized understanding and consent with the discharge plan as it was reviewed with her. All questions were fielded by provider and/or  clinic staff prior to patient discharge.    Final Clinical Impressions / Urgent Care Diagnoses:   Final diagnoses:  History of seasonal allergies  Fluid level behind tympanic membrane of both ears  Dizziness    New Prescriptions:  [place] Controlled Substance Registry consulted? Not Applicable  Meds ordered this encounter  Medications  . loratadine-pseudoephedrine (CLARITIN-D 24 HOUR) 10-240 MG 24 hr tablet    Sig: Take 1 tablet by mouth daily.    Dispense:  30 tablet    Refill:  0  . meclizine (ANTIVERT) 25 MG tablet    Sig: Take 1 tablet (25 mg total) by mouth 3 (three) times daily as needed for dizziness.    Dispense:  30 tablet    Refill:  0    Recommended Follow up Care:  Patient encouraged to follow up with the following provider within the specified time frame, or sooner as dictated by the severity of her symptoms. As always, she was instructed that for any urgent/emergent care needs, she should seek care either here or in the emergency department for more immediate evaluation.  Follow-up Information    PCP In 1 week.         NOTE: This note was prepared  using Scientist, clinical (histocompatibility and immunogenetics)Dragon dictation software along with smaller Lobbyistphrase technology. Despite my best ability to proofread, there is the potential that transcriptional errors may still occur from this process, and are completely unintentional.    Verlee MonteGray, Fenton Candee E, NP 08/12/19 1016

## 2020-01-19 ENCOUNTER — Emergency Department
Admission: EM | Admit: 2020-01-19 | Discharge: 2020-01-19 | Disposition: A | Discharge status: Home / Self Care | Payer: 59 | Attending: Emergency Medicine | Admitting: Emergency Medicine

## 2020-01-19 ENCOUNTER — Encounter: Payer: Self-pay | Admitting: Emergency Medicine

## 2020-01-19 ENCOUNTER — Other Ambulatory Visit: Payer: Self-pay

## 2020-01-19 DIAGNOSIS — Y92009 Unspecified place in unspecified non-institutional (private) residence as the place of occurrence of the external cause: Secondary | ICD-10-CM | POA: Insufficient documentation

## 2020-01-19 DIAGNOSIS — Z87891 Personal history of nicotine dependence: Secondary | ICD-10-CM | POA: Insufficient documentation

## 2020-01-19 DIAGNOSIS — Y939 Activity, unspecified: Secondary | ICD-10-CM | POA: Insufficient documentation

## 2020-01-19 DIAGNOSIS — R110 Nausea: Secondary | ICD-10-CM | POA: Diagnosis not present

## 2020-01-19 DIAGNOSIS — T588X1A Toxic effect of carbon monoxide from other source, accidental (unintentional), initial encounter: Secondary | ICD-10-CM | POA: Diagnosis not present

## 2020-01-19 DIAGNOSIS — Y999 Unspecified external cause status: Secondary | ICD-10-CM | POA: Diagnosis not present

## 2020-01-19 DIAGNOSIS — Z7729 Contact with and (suspected ) exposure to other hazardous substances: Secondary | ICD-10-CM

## 2020-01-19 DIAGNOSIS — R519 Headache, unspecified: Secondary | ICD-10-CM

## 2020-01-19 LAB — COOXEMETRY PANEL
Carboxyhemoglobin: 1.3 % (ref 0.5–1.5)
Methemoglobin: 0.7 % (ref 0.0–1.5)
O2 Saturation: 99.6 %
Total hemoglobin: 14.2 g/dL (ref 12.0–16.0)
Total oxygen content: 97.7 mL/dL

## 2020-01-19 MED ORDER — BUTALBITAL-APAP-CAFFEINE 50-325-40 MG PO TABS
1.0000 | ORAL_TABLET | Freq: Once | ORAL | Status: AC
  Administered 2020-01-19: 1 via ORAL
  Filled 2020-01-19: qty 1

## 2020-01-19 NOTE — Discharge Instructions (Addendum)
Please seek medical attention for any high fevers, chest pain, shortness of breath, change in behavior, persistent vomiting, bloody stool or any other new or concerning symptoms.  

## 2020-01-19 NOTE — ED Notes (Signed)
Pt was not wearing non-rebreather mask, stated she took it off because she was having difficulty talking.  Mask was replaced, 15L.

## 2020-01-19 NOTE — ED Triage Notes (Signed)
Pt presents from home via [facility] with c/o carbon monoxide poisoning. EMS reports that pt was exposed to high levels of carbon monoxide and cyanide in her house. EMS reports leak at house. Pt c/o headache and dizziness. EMS reported pt's CO2 reading was 55. Pt currently alert and oriented x4 at this time.

## 2020-01-19 NOTE — ED Notes (Signed)
Pt is still reporting headache 6/10.

## 2020-01-19 NOTE — ED Provider Notes (Signed)
[facility] Emergency Department Provider Note   ____________________________________________   I have reviewed the triage vital signs and the nursing notes.   HISTORY  Chief Complaint Headache  History limited by: Not Limited   HPI Wendy Andersen is a 49 y.o. female who presents to the emergency department today because of concerns for carbon monoxide poisoning.  Patient states for the past couple of days she has had a headache and is noticed some slight nausea.  The patient apparently has been using gas logs in her house due to heating bills being very high.  She states that the other member of her household have not complained of similar symptoms although they do leave the house to go to work during the day.  Patient did have fire department come to her house and they noticed the smell of the gas.  They did check her carbon monoxide level and found it to be elevated.  Records reviewed. Per medical record review patient has a history of sinusitis.  History reviewed. No pertinent past medical history.  There are no problems to display for this patient.   Past Surgical History:  Procedure Laterality Date  . FRACTURE SURGERY      Prior to Admission medications   Medication Sig Start Date End Date Taking? Authorizing Provider  fluticasone (FLONASE) 50 MCG/ACT nasal spray Place 1 spray into both nostrils 2 (two) times daily. 01/20/16 01/27/16  Betancourt, Aura Fey, NP  loratadine-pseudoephedrine (CLARITIN-D 24 HOUR) 10-240 MG 24 hr tablet Take 1 tablet by mouth daily. 08/12/19   Karen Kitchens, NP  meclizine (ANTIVERT) 25 MG tablet Take 1 tablet (25 mg total) by mouth 3 (three) times daily as needed for dizziness. 08/12/19   Karen Kitchens, NP    Allergies Patient has no known allergies.  History reviewed. No pertinent family history.  Social History Social History   Tobacco Use  . Smoking status: Former Research scientist (life sciences)  . Smokeless tobacco: Never Used  Substance  Use Topics  . Alcohol use: No  . Drug use: Never    Review of Systems Constitutional: No fever/chills Eyes: No visual changes. ENT: No sore throat. Cardiovascular: Denies chest pain. Respiratory: Denies shortness of breath. Gastrointestinal: No abdominal pain.  Positive for nausea.  Genitourinary: Negative for dysuria. Musculoskeletal: Negative for back pain. Skin: Negative for rash. Neurological: Positive for headache.   ____________________________________________   PHYSICAL EXAM:  VITAL SIGNS: ED Triage Vitals  Enc Vitals Group     BP 01/19/20 1707 107/83     Pulse Rate 01/19/20 1707 (!) 106     Resp 01/19/20 1707 16     Temp 01/19/20 1707 97.6 F (36.4 C)     Temp Source 01/19/20 1707 Oral     SpO2 01/19/20 1707 98 %     Weight 01/19/20 1709 245 lb (111.1 kg)     Height 01/19/20 1709 5\' 7"  (1.702 m)     Head Circumference --      Peak Flow --      Pain Score 01/19/20 1708 0   Constitutional: Alert and oriented.  Eyes: Conjunctivae are normal.  ENT      Head: Normocephalic and atraumatic.      Nose: No congestion/rhinnorhea.      Mouth/Throat: Mucous membranes are moist.      Neck: No stridor. Hematological/Lymphatic/Immunilogical: No cervical lymphadenopathy. Cardiovascular: Normal rate, regular rhythm.  No murmurs, rubs, or gallops.  Respiratory: Normal respiratory effort without tachypnea nor retractions. Breath sounds are  clear and equal bilaterally. No wheezes/rales/rhonchi. Gastrointestinal: Soft and non tender. No rebound. No guarding.  Genitourinary: Deferred Musculoskeletal: Normal range of motion in all extremities. No lower extremity edema. Neurologic:  Normal speech and language. No gross focal neurologic deficits are appreciated.  Skin:  Skin is warm, dry and intact. No rash noted. Psychiatric: Mood and affect are normal. Speech and behavior are normal. Patient exhibits appropriate insight and  judgment.  ____________________________________________    LABS (pertinent positives/negatives)  Carboxyhemoglobin 13 -> 1.3  ____________________________________________   EKG  I, Phineas Semen, attending physician, personally viewed and interpreted this EKG  EKG Time: 1709 Rate: 105 Rhythm: sinus tachycardia Axis: normal Intervals: qtc 426 QRS: narrow, q waves v1, v2 ST changes: no st elevation Impression: abnormal ekg   ____________________________________________    RADIOLOGY  None  ____________________________________________   PROCEDURES  Procedures  ____________________________________________   INITIAL IMPRESSION / ASSESSMENT AND PLAN / ED COURSE  Pertinent labs & imaging results that were available during my care of the patient were reviewed by me and considered in my medical decision making (see chart for details).   Patient presented to the emergency department today with concerns for headache as well as some nausea and carbon monoxide exposure.  Patient's initial cooximetry did show an elevated carboxyhemoglobin level.  Patient was placed on nonrebreather.  During her time in the emergency department her headache did improve.  Repeat carboxyhemoglobin down to 1.3.  This point I do think patient likely did suffer from some carbon monoxide exposure.  Given improvement of symptoms and carboxyhemoglobin I do think is reasonable for patient to be discharged home. ____________________________________________   FINAL CLINICAL IMPRESSION(S) / ED DIAGNOSES  Final diagnoses:  Carbon monoxide exposure  Nonintractable headache, unspecified chronicity pattern, unspecified headache type     Note: This dictation was prepared with Dragon dictation. Any transcriptional errors that result from this process are unintentional     Phineas Semen, MD 01/19/20 [phone]

## 2020-02-16 LAB — COOXEMETRY PANEL
Methemoglobin: 0.5 % (ref 0.0–1.5)
O2 Saturation: 99.9 %
Total oxygen content: 85 mL/dL

## 2024-01-31 ENCOUNTER — Ambulatory Visit: Payer: 59

## 2024-01-31 DIAGNOSIS — Z1211 Encounter for screening for malignant neoplasm of colon: Secondary | ICD-10-CM | POA: Diagnosis present

## 2024-01-31 DIAGNOSIS — D123 Benign neoplasm of transverse colon: Secondary | ICD-10-CM | POA: Diagnosis not present

## 2024-01-31 DIAGNOSIS — D125 Benign neoplasm of sigmoid colon: Secondary | ICD-10-CM | POA: Diagnosis not present

## 2024-01-31 DIAGNOSIS — K621 Rectal polyp: Secondary | ICD-10-CM | POA: Diagnosis not present

## 2024-01-31 DIAGNOSIS — K640 First degree hemorrhoids: Secondary | ICD-10-CM | POA: Diagnosis not present
